# Patient Record
Sex: Male | Born: 1980 | Race: Black or African American | Hispanic: No | Marital: Single | Smoking: Never smoker
Health system: Southern US, Community
[De-identification: ages and names within clinical notes are randomized; demographics above are authoritative.]

## PROBLEM LIST (undated history)

## (undated) ENCOUNTER — Ambulatory Visit (HOSPITAL_COMMUNITY): Disposition: A | Discharge status: Other Acute Inpatient Hospital | Payer: Self-pay

## (undated) DIAGNOSIS — R51 Headache: Secondary | ICD-10-CM

## (undated) DIAGNOSIS — R519 Headache, unspecified: Secondary | ICD-10-CM

## (undated) HISTORY — DX: Headache, unspecified: R51.9

## (undated) HISTORY — DX: Headache: R51

## (undated) HISTORY — PX: JOINT REPLACEMENT: SHX530

---
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
## 2003-05-20 ENCOUNTER — Encounter: Payer: Self-pay | Admitting: Orthopaedic Surgery

## 2003-05-21 ENCOUNTER — Encounter: Payer: Self-pay | Admitting: Orthopaedic Surgery

## 2003-05-21 ENCOUNTER — Inpatient Hospital Stay (HOSPITAL_COMMUNITY): Admission: AC | Admit: 2003-05-21 | Discharge: 2003-05-27 | Payer: Self-pay

## 2003-05-24 ENCOUNTER — Encounter: Payer: Self-pay | Admitting: Orthopedic Surgery

## 2017-05-26 ENCOUNTER — Emergency Department (HOSPITAL_COMMUNITY): Payer: Self-pay

## 2017-05-26 ENCOUNTER — Encounter (HOSPITAL_COMMUNITY): Payer: Self-pay | Admitting: Emergency Medicine

## 2017-05-26 ENCOUNTER — Emergency Department (HOSPITAL_COMMUNITY)
Admission: EM | Admit: 2017-05-26 | Discharge: 2017-05-26 | Disposition: A | Discharge status: Home / Self Care | Payer: Self-pay | Attending: Emergency Medicine | Admitting: Emergency Medicine

## 2017-05-26 DIAGNOSIS — F17200 Nicotine dependence, unspecified, uncomplicated: Secondary | ICD-10-CM | POA: Insufficient documentation

## 2017-05-26 DIAGNOSIS — W19XXXA Unspecified fall, initial encounter: Secondary | ICD-10-CM

## 2017-05-26 DIAGNOSIS — M79642 Pain in left hand: Secondary | ICD-10-CM | POA: Insufficient documentation

## 2017-05-26 DIAGNOSIS — M25532 Pain in left wrist: Secondary | ICD-10-CM | POA: Insufficient documentation

## 2017-05-26 NOTE — Discharge Instructions (Signed)
You can take tylenol or motrin for pain.   I would wear the brace while you're working at least, can wear more often if you need to. Follow-up with your primary care doctor. Return to the ED for new or worsening symptoms.
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
## 2017-05-26 NOTE — Progress Notes (Signed)
Orthopedic Tech Progress Note Patient Details:  Raymond Wheeler May 15, 1981 [id number]  Ortho Devices Type of Ortho Device: Ace wrap, Velcro wrist forearm splint Ortho Device/Splint Interventions: Application   Saul Fordyce 05/26/2017, 7:59 PM

## 2018-09-03 HISTORY — PX: MOUTH SURGERY: SHX715

## 2018-09-26 ENCOUNTER — Observation Stay (HOSPITAL_COMMUNITY)
Admission: EM | Admit: 2018-09-26 | Discharge: 2018-09-27 | Disposition: A | Discharge status: Home / Self Care | Payer: BLUE CROSS/BLUE SHIELD | Attending: Oral Surgery | Admitting: Oral Surgery

## 2018-09-26 ENCOUNTER — Emergency Department (HOSPITAL_COMMUNITY): Payer: BLUE CROSS/BLUE SHIELD

## 2018-09-26 DIAGNOSIS — S02401B Maxillary fracture, unspecified, initial encounter for open fracture: Secondary | ICD-10-CM

## 2018-09-26 DIAGNOSIS — S0292XB Unspecified fracture of facial bones, initial encounter for open fracture: Secondary | ICD-10-CM

## 2018-09-26 DIAGNOSIS — S032XXA Dislocation of tooth, initial encounter: Secondary | ICD-10-CM

## 2018-09-26 DIAGNOSIS — S0993XA Unspecified injury of face, initial encounter: Secondary | ICD-10-CM

## 2018-09-26 DIAGNOSIS — S022XXA Fracture of nasal bones, initial encounter for closed fracture: Secondary | ICD-10-CM | POA: Insufficient documentation

## 2018-09-26 DIAGNOSIS — Y92410 Unspecified street and highway as the place of occurrence of the external cause: Secondary | ICD-10-CM | POA: Insufficient documentation

## 2018-09-26 DIAGNOSIS — S0242XB Fracture of alveolus of maxilla, initial encounter for open fracture: Principal | ICD-10-CM | POA: Insufficient documentation

## 2018-09-26 DIAGNOSIS — N3090 Cystitis, unspecified without hematuria: Secondary | ICD-10-CM | POA: Diagnosis not present

## 2018-09-26 DIAGNOSIS — T148XXA Other injury of unspecified body region, initial encounter: Secondary | ICD-10-CM

## 2018-09-26 DIAGNOSIS — Z419 Encounter for procedure for purposes other than remedying health state, unspecified: Secondary | ICD-10-CM

## 2018-09-26 LAB — URINALYSIS, ROUTINE W REFLEX MICROSCOPIC
Bilirubin Urine: NEGATIVE
Glucose, UA: NEGATIVE mg/dL
Hgb urine dipstick: NEGATIVE
Ketones, ur: 5 mg/dL — AB
Nitrite: POSITIVE — AB
Protein, ur: NEGATIVE mg/dL
Specific Gravity, Urine: >1.046 — ABNORMAL HIGH (ref 1.005–1.030)
pH: 8 (ref 5.0–8.0)

## 2018-09-26 LAB — COMPREHENSIVE METABOLIC PANEL
ALT: 22 U/L (ref 0–44)
AST: 39 U/L (ref 15–41)
Albumin: 5.1 g/dL — ABNORMAL HIGH (ref 3.5–5.0)
Alkaline Phosphatase: 47 U/L (ref 38–126)
Anion gap: 16 — ABNORMAL HIGH (ref 5–15)
BUN: 10 mg/dL (ref 6–20)
CALCIUM: 10.2 mg/dL (ref 8.9–10.3)
CO2: 20 mmol/L — ABNORMAL LOW (ref 22–32)
Chloride: 101 mmol/L (ref 98–111)
Creatinine, Ser: 1.16 mg/dL (ref 0.61–1.24)
GFR calc non Af Amer: >60 mL/min (ref >60)
Glucose, Bld: 123 mg/dL — ABNORMAL HIGH (ref 70–99)
Potassium: 3.5 mmol/L (ref 3.5–5.1)
Sodium: 137 mmol/L (ref 135–145)
Total Bilirubin: 1.3 mg/dL — ABNORMAL HIGH (ref 0.3–1.2)
Total Protein: 8.3 g/dL — ABNORMAL HIGH (ref 6.5–8.1)

## 2018-09-26 LAB — SAMPLE TO BLOOD BANK

## 2018-09-26 LAB — CBC
HCT: 44.1 % (ref 39.0–52.0)
Hemoglobin: 15 g/dL (ref 13.0–17.0)
MCH: 32.5 pg (ref 26.0–34.0)
MCHC: 34 g/dL (ref 30.0–36.0)
MCV: 95.5 fL (ref 80.0–100.0)
NRBC: 0 % (ref 0.0–0.2)
Platelets: 183 10*3/uL (ref 150–400)
RBC: 4.62 MIL/uL (ref 4.22–5.81)
RDW: 13.2 % (ref 11.5–15.5)
WBC: 4.6 10*3/uL (ref 4.0–10.5)

## 2018-09-26 LAB — I-STAT CREATININE, ED: Creatinine, Ser: 1 mg/dL (ref 0.61–1.24)

## 2018-09-26 LAB — PROTIME-INR
INR: 1.06
Prothrombin Time: 13.7 seconds (ref 11.4–15.2)

## 2018-09-26 LAB — ETHANOL: Alcohol, Ethyl (B): <10 mg/dL (ref <10)

## 2018-09-26 LAB — CDS SEROLOGY

## 2018-09-26 LAB — LACTIC ACID, PLASMA: Lactic Acid, Venous: 6.9 mmol/L (ref 0.5–1.9)

## 2018-09-26 MED ORDER — TETANUS-DIPHTH-ACELL PERTUSSIS 5-2.5-18.5 LF-MCG/0.5 IM SUSP
0.5000 mL | Freq: Once | INTRAMUSCULAR | Status: AC
  Administered 2018-09-26: 0.5 mL via INTRAMUSCULAR
  Filled 2018-09-26: qty 0.5

## 2018-09-26 MED ORDER — LACTATED RINGERS IV BOLUS
1000.0000 mL | Freq: Once | INTRAVENOUS | Status: AC
  Administered 2018-09-26: 1000 mL via INTRAVENOUS

## 2018-09-26 MED ORDER — MORPHINE SULFATE (PF) 4 MG/ML IV SOLN
4.0000 mg | Freq: Once | INTRAVENOUS | Status: AC
  Administered 2018-09-26: 4 mg via INTRAVENOUS
  Filled 2018-09-26: qty 1

## 2018-09-26 MED ORDER — IOPAMIDOL (ISOVUE-370) INJECTION 76%
100.0000 mL | Freq: Once | INTRAVENOUS | Status: AC | PRN
  Administered 2018-09-26: 100 mL via INTRAVENOUS
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
## 2018-09-26 NOTE — ED Notes (Signed)
Patient transported to CT with RN 

## 2018-09-26 NOTE — Procedures (Signed)
Preop diagnosis: Lower lip laceration Postop diagnosis: same Procedure: Moderate complexity closure lower lip laceration, 3 cm Surgeon: Jenne Pane Anesth: Local with 2% lidocaine with 1:200,000 epinephrine Compl: None Findings: Through-and-through lower lip wound Description:  After discussing risks, benefits, and alternatives, the patient was placed in a reclined position.  The lower face was prepped with Betadyne and draped.  The lower lip wound was injected with local anesthetic.  The wound was then copiously irrigated with saline.  The deep muscle was closed with 4-0 Vicryl in a simple, interrupted fashion.  The skin was closed with 5-0 Nylon in a simple, running fashion.  The inner surface of the wound was left open.  He tolerate the procedure well and was returned to nursing care in stable condition.
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
## 2018-09-27 ENCOUNTER — Observation Stay (HOSPITAL_COMMUNITY): Payer: BLUE CROSS/BLUE SHIELD

## 2018-09-27 ENCOUNTER — Observation Stay (HOSPITAL_COMMUNITY): Payer: BLUE CROSS/BLUE SHIELD | Admitting: Certified Registered"

## 2018-09-27 ENCOUNTER — Encounter (HOSPITAL_COMMUNITY)
Admission: EM | Disposition: A | Discharge status: Home / Self Care | Payer: Self-pay | Source: Home / Self Care | Attending: Emergency Medicine

## 2018-09-27 ENCOUNTER — Other Ambulatory Visit: Payer: Self-pay

## 2018-09-27 ENCOUNTER — Encounter (HOSPITAL_COMMUNITY): Payer: Self-pay | Admitting: Certified Registered"

## 2018-09-27 DIAGNOSIS — S0242XB Fracture of alveolus of maxilla, initial encounter for open fracture: Secondary | ICD-10-CM | POA: Diagnosis present

## 2018-09-27 HISTORY — PX: ORIF FACIAL FRACTURE: SHX2118

## 2018-09-27 LAB — SURGICAL PCR SCREEN
MRSA, PCR: NEGATIVE
Staphylococcus aureus: NEGATIVE

## 2018-09-27 SURGERY — OPEN REDUCTION INTERNAL FIXATION (ORIF) MULTIPLE FACIAL FRACTURES
Anesthesia: General | Site: Face

## 2018-09-27 MED ORDER — METOPROLOL TARTRATE 5 MG/5ML IV SOLN: 5.0000 mg | Freq: Four times a day (QID) | INTRAVENOUS | Status: DC | PRN

## 2018-09-27 MED ORDER — ONDANSETRON HCL 4 MG/2ML IJ SOLN
INTRAMUSCULAR | Status: AC
  Filled 2018-09-27: qty 4

## 2018-09-27 MED ORDER — LIDOCAINE-EPINEPHRINE 2 %-1:100000 IJ SOLN
INTRAMUSCULAR | Status: AC
  Filled 2018-09-27: qty 1

## 2018-09-27 MED ORDER — MIDAZOLAM HCL 2 MG/2ML IJ SOLN
INTRAMUSCULAR | Status: AC
  Filled 2018-09-27: qty 2

## 2018-09-27 MED ORDER — HYDROMORPHONE HCL 1 MG/ML IJ SOLN
1.0000 mg | INTRAMUSCULAR | Status: DC | PRN
  Administered 2018-09-27: 1 mg via INTRAVENOUS
  Filled 2018-09-27: qty 1

## 2018-09-27 MED ORDER — CHLORHEXIDINE GLUCONATE 0.12 % MT SOLN: 15.0000 mL | Freq: Two times a day (BID) | OROMUCOSAL | 0 refills | Status: AC

## 2018-09-27 MED ORDER — PROMETHAZINE HCL 25 MG/ML IJ SOLN: 6.2500 mg | INTRAMUSCULAR | Status: DC | PRN

## 2018-09-27 MED ORDER — DEXAMETHASONE SODIUM PHOSPHATE 10 MG/ML IJ SOLN
INTRAMUSCULAR | Status: AC
  Filled 2018-09-27: qty 1

## 2018-09-27 MED ORDER — OXYCODONE HCL 5 MG PO TABS
5.0000 mg | ORAL_TABLET | ORAL | Status: DC | PRN
  Administered 2018-09-27: 10 mg via ORAL
  Filled 2018-09-27: qty 2

## 2018-09-27 MED ORDER — OXYMETAZOLINE HCL 0.05 % NA SOLN
NASAL | Status: AC
  Filled 2018-09-27: qty 15

## 2018-09-27 MED ORDER — ROCURONIUM BROMIDE 50 MG/5ML IV SOSY
PREFILLED_SYRINGE | INTRAVENOUS | Status: AC
  Filled 2018-09-27: qty 5

## 2018-09-27 MED ORDER — FENTANYL CITRATE (PF) 100 MCG/2ML IJ SOLN: 25.0000 ug | INTRAMUSCULAR | Status: DC | PRN

## 2018-09-27 MED ORDER — CLINDAMYCIN HCL 300 MG PO CAPS: 300.0000 mg | ORAL_CAPSULE | Freq: Three times a day (TID) | ORAL | 0 refills | Status: DC

## 2018-09-27 MED ORDER — FENTANYL CITRATE (PF) 100 MCG/2ML IJ SOLN
INTRAMUSCULAR | Status: DC | PRN
  Administered 2018-09-27: 100 ug via INTRAVENOUS

## 2018-09-27 MED ORDER — CLINDAMYCIN PHOSPHATE 600 MG/50ML IV SOLN
600.0000 mg | Freq: Four times a day (QID) | INTRAVENOUS | Status: DC
  Administered 2018-09-27 (×3): 600 mg via INTRAVENOUS
  Filled 2018-09-27 (×4): qty 50

## 2018-09-27 MED ORDER — DEXTROSE-NACL 5-0.45 % IV SOLN
INTRAVENOUS | Status: DC
  Administered 2018-09-27: 02:00:00 via INTRAVENOUS

## 2018-09-27 MED ORDER — SODIUM CHLORIDE 0.9 % IV SOLN: 1.0000 g | Freq: Once | INTRAVENOUS | Status: DC

## 2018-09-27 MED ORDER — CIPROFLOXACIN HCL 500 MG PO TABS: 500.0000 mg | ORAL_TABLET | Freq: Two times a day (BID) | ORAL | 0 refills | Status: AC

## 2018-09-27 MED ORDER — MORPHINE SULFATE (PF) 4 MG/ML IV SOLN: 4.0000 mg | Freq: Once | INTRAVENOUS | Status: DC

## 2018-09-27 MED ORDER — ONDANSETRON HCL 4 MG/2ML IJ SOLN
INTRAMUSCULAR | Status: DC | PRN
  Administered 2018-09-27: 4 mg via INTRAVENOUS

## 2018-09-27 MED ORDER — LIDOCAINE-EPINEPHRINE 2 %-1:100000 IJ SOLN
INTRAMUSCULAR | Status: DC | PRN
  Administered 2018-09-27: 14 mL via INTRADERMAL

## 2018-09-27 MED ORDER — LIDOCAINE-EPINEPHRINE 2 %-1:100000 IJ SOLN
INTRAMUSCULAR | Status: AC
  Filled 2018-09-27: qty 1.7

## 2018-09-27 MED ORDER — PROPOFOL 10 MG/ML IV BOLUS
INTRAVENOUS | Status: DC | PRN
  Administered 2018-09-27: 150 mg via INTRAVENOUS

## 2018-09-27 MED ORDER — LIDOCAINE 2% (20 MG/ML) 5 ML SYRINGE
INTRAMUSCULAR | Status: AC
  Filled 2018-09-27: qty 5

## 2018-09-27 MED ORDER — MIDAZOLAM HCL 5 MG/5ML IJ SOLN
INTRAMUSCULAR | Status: DC | PRN
  Administered 2018-09-27: 2 mg via INTRAVENOUS

## 2018-09-27 MED ORDER — LACTATED RINGERS IV BOLUS: 1000.0000 mL | Freq: Once | INTRAVENOUS | Status: DC

## 2018-09-27 MED ORDER — ROCURONIUM BROMIDE 10 MG/ML (PF) SYRINGE
PREFILLED_SYRINGE | INTRAVENOUS | Status: DC | PRN
  Administered 2018-09-27: 50 mg via INTRAVENOUS

## 2018-09-27 MED ORDER — ONDANSETRON 4 MG PO TBDP: 4.0000 mg | ORAL_TABLET | Freq: Four times a day (QID) | ORAL | Status: DC | PRN

## 2018-09-27 MED ORDER — LACTATED RINGERS IV SOLN
INTRAVENOUS | Status: DC | PRN
  Administered 2018-09-27: 07:00:00 via INTRAVENOUS

## 2018-09-27 MED ORDER — FENTANYL CITRATE (PF) 250 MCG/5ML IJ SOLN
INTRAMUSCULAR | Status: AC
  Filled 2018-09-27: qty 5

## 2018-09-27 MED ORDER — OXYCODONE-ACETAMINOPHEN 5-325 MG PO TABS: 1.0000 | ORAL_TABLET | ORAL | 0 refills | Status: AC | PRN

## 2018-09-27 MED ORDER — OXYMETAZOLINE HCL 0.05 % NA SOLN
NASAL | Status: DC | PRN
  Administered 2018-09-27: 1 via NASAL

## 2018-09-27 MED ORDER — LIDOCAINE 2% (20 MG/ML) 5 ML SYRINGE
INTRAMUSCULAR | Status: DC | PRN
  Administered 2018-09-27: 60 mg via INTRAVENOUS

## 2018-09-27 MED ORDER — OXYCODONE HCL 5 MG PO TABS
10.0000 mg | ORAL_TABLET | Freq: Once | ORAL | Status: AC
  Administered 2018-09-27: 10 mg via ORAL
  Filled 2018-09-27: qty 2

## 2018-09-27 MED ORDER — SUGAMMADEX SODIUM 200 MG/2ML IV SOLN
INTRAVENOUS | Status: DC | PRN
  Administered 2018-09-27: 200 mg via INTRAVENOUS

## 2018-09-27 MED ORDER — BACITRACIN ZINC 500 UNIT/GM EX OINT
TOPICAL_OINTMENT | CUTANEOUS | Status: AC
  Filled 2018-09-27: qty 28.35

## 2018-09-27 MED ORDER — PROPOFOL 10 MG/ML IV BOLUS
INTRAVENOUS | Status: AC
  Filled 2018-09-27: qty 20

## 2018-09-27 MED ORDER — DEXAMETHASONE SODIUM PHOSPHATE 10 MG/ML IJ SOLN
INTRAMUSCULAR | Status: DC | PRN
  Administered 2018-09-27: 10 mg via INTRAVENOUS

## 2018-09-27 MED ORDER — ONDANSETRON HCL 4 MG/2ML IJ SOLN
4.0000 mg | Freq: Four times a day (QID) | INTRAMUSCULAR | Status: DC | PRN
  Administered 2018-09-27: 4 mg via INTRAVENOUS
  Filled 2018-09-27: qty 2

## 2018-09-27 MED ORDER — ONDANSETRON HCL 4 MG/2ML IJ SOLN: 4.0000 mg | Freq: Once | INTRAMUSCULAR | Status: DC

## 2018-09-27 MED ORDER — 0.9 % SODIUM CHLORIDE (POUR BTL) OPTIME
TOPICAL | Status: DC | PRN
  Administered 2018-09-27: 1000 mL

## 2018-09-27 SURGICAL SUPPLY — 46 items
APPLICATOR COTTON TIP 6IN STRL (MISCELLANEOUS) ×3 IMPLANT
BENZOIN TINCTURE PRP APPL 2/3 (GAUZE/BANDAGES/DRESSINGS) ×3 IMPLANT
BLADE 10 SAFETY STRL DISP (BLADE) ×3 IMPLANT
BLADE SURG 15 STRL LF DISP TIS (BLADE) ×1 IMPLANT
BLADE SURG 15 STRL SS (BLADE) ×2
CANISTER SUCT 3000ML PPV (MISCELLANEOUS) ×6 IMPLANT
CLOSURE WOUND 1/2 X4 (GAUZE/BANDAGES/DRESSINGS)
CONFORMERS SILICONE 5649 (OPHTHALMIC RELATED) IMPLANT
COVER SURGICAL LIGHT HANDLE (MISCELLANEOUS) ×3 IMPLANT
COVER WAND RF STERILE (DRAPES) ×3 IMPLANT
DECANTER SPIKE VIAL GLASS SM (MISCELLANEOUS) ×3 IMPLANT
ELECT COATED BLADE 2.86 ST (ELECTRODE) IMPLANT
ELECT NEEDLE BLADE 2-5/6 (NEEDLE) IMPLANT
ELECT REM PT RETURN 9FT ADLT (ELECTROSURGICAL) ×3
ELECTRODE REM PT RTRN 9FT ADLT (ELECTROSURGICAL) ×1 IMPLANT
GAUZE 4X4 16PLY RFD (DISPOSABLE) ×3 IMPLANT
GAUZE PACKING FOLDED 2  STR (GAUZE/BANDAGES/DRESSINGS)
GAUZE PACKING FOLDED 2 STR (GAUZE/BANDAGES/DRESSINGS) IMPLANT
GLOVE BIO SURGEON STRL SZ 6.5 (GLOVE) ×6 IMPLANT
GLOVE BIO SURGEON STRL SZ7.5 (GLOVE) ×9 IMPLANT
GLOVE BIO SURGEONS STRL SZ 6.5 (GLOVE) ×3
GOWN STRL REUS W/ TWL LRG LVL3 (GOWN DISPOSABLE) ×2 IMPLANT
GOWN STRL REUS W/ TWL XL LVL3 (GOWN DISPOSABLE) ×1 IMPLANT
GOWN STRL REUS W/TWL LRG LVL3 (GOWN DISPOSABLE) ×4
GOWN STRL REUS W/TWL XL LVL3 (GOWN DISPOSABLE) ×2
KIT TURNOVER KIT B (KITS) ×3 IMPLANT
NEEDLE BLUNT 16X1.5 OR ONLY (NEEDLE) IMPLANT
NS IRRIG 1000ML POUR BTL (IV SOLUTION) ×3 IMPLANT
PAD ARMBOARD 7.5X6 YLW CONV (MISCELLANEOUS) ×6 IMPLANT
PENCIL BUTTON HOLSTER BLD 10FT (ELECTRODE) ×3 IMPLANT
SCISSORS WIRE ANG 4 3/4 DISP (INSTRUMENTS) IMPLANT
SLEEVE IRRIGATION ELITE 7 (MISCELLANEOUS) ×3 IMPLANT
STRIP CLOSURE SKIN 1/2X4 (GAUZE/BANDAGES/DRESSINGS) IMPLANT
SUT CHROMIC 3 0 PS 2 (SUTURE) ×6 IMPLANT
SUT ETHILON 5 0 P 3 18 (SUTURE) ×2
SUT NYLON ETHILON 5-0 P-3 1X18 (SUTURE) ×1 IMPLANT
SUT STEEL 0 (SUTURE)
SUT STEEL 0 18XMFL TIE 17 (SUTURE) IMPLANT
SUT STEEL 2 (SUTURE) IMPLANT
SYR 50ML SLIP (SYRINGE) ×3 IMPLANT
TRAY ENT MC OR (CUSTOM PROCEDURE TRAY) ×3 IMPLANT
TUBE CONNECTING 12'X1/4 (SUCTIONS) ×1
TUBE CONNECTING 12X1/4 (SUCTIONS) ×2 IMPLANT
TUBING IRRIGATION (MISCELLANEOUS) IMPLANT
WATER STERILE IRR 1000ML POUR (IV SOLUTION) ×3 IMPLANT
YANKAUER SUCT BULB TIP NO VENT (SUCTIONS) ×3 IMPLANT

## 2018-09-27 NOTE — Anesthesia Preprocedure Evaluation (Addendum)
Anesthesia Evaluation  Patient identified by MRN, date of birth, ID band Patient awake    Reviewed: Allergy & Precautions, NPO status , Patient's Chart, lab work & pertinent test results  Airway Mallampati: II  TM Distance: >3 FB Neck ROM: Full    Dental  (+) Dental Advisory Given, Poor Dentition, Loose, Missing,    Pulmonary neg pulmonary ROS,    Pulmonary exam normal breath sounds clear to auscultation       Cardiovascular negative cardio ROS Normal cardiovascular exam Rhythm:Regular Rate:Normal     Neuro/Psych negative neurological ROS     GI/Hepatic negative GI ROS, Neg liver ROS,   Endo/Other  negative endocrine ROS  Renal/GU negative Renal ROS     Musculoskeletal MAXILLARY FRACTURE   Abdominal   Peds  Hematology negative hematology ROS (+)   Anesthesia Other Findings Day of surgery medications reviewed with the patient.  Reproductive/Obstetrics                            Anesthesia Physical Anesthesia Plan  ASA: I  Anesthesia Plan: General   Post-op Pain Management:    Induction: Intravenous  PONV Risk Score and Plan: 3 and Midazolam, Ondansetron and Dexamethasone  Airway Management Planned: Nasal ETT  Additional Equipment: None  Intra-op Plan:   Post-operative Plan: Extubation in OR  Informed Consent: I have reviewed the patients History and Physical, chart, labs and discussed the procedure including the risks, benefits and alternatives for the proposed anesthesia with the patient or authorized representative who has indicated his/her understanding and acceptance.     Dental advisory given  Plan Discussed with: CRNA, Anesthesiologist and Surgeon  Anesthesia Plan Comments:       Anesthesia Quick Evaluation

## 2018-09-27 NOTE — Progress Notes (Signed)
RN verified the presence of a signed informed consent that matches stated procedure by patient. Consent signed in Short Stay. Verified armband matches patient's stated name and birth date. Verified NPO status and that all jewelry, contact, glasses, dentures, and partials had been removed (if applicable).  

## 2018-09-27 NOTE — H&P (Signed)
H&P documentation  -History and Physical Reviewed  -Patient has been re-examined  -No change in the plan of care  Raymond Wheeler  

## 2018-09-27 NOTE — Plan of Care (Signed)
  Problem: Education: Goal: Knowledge of General Education information will improve Description Including pain rating scale, medication(s)/side effects and non-pharmacologic comfort measures Outcome: Progressing   Problem: Clinical Measurements: Goal: Ability to maintain clinical measurements within normal limits will improve Outcome: Progressing Goal: Diagnostic test results will improve Outcome: Progressing Goal: Respiratory complications will improve Outcome: Progressing Goal: Cardiovascular complication will be avoided Outcome: Progressing   Problem: Elimination: Goal: Will not experience complications related to bowel motility Outcome: Progressing   Problem: Pain Managment: Goal: General experience of comfort will improve Outcome: Progressing

## 2018-09-27 NOTE — Op Note (Signed)
NAME: Raymond Wheeler, Raymond Wheeler MEDICAL RECORD HL:[id number] ACCOUNT [id number] DATE OF BIRTH:1981/06/13 FACILITY: [facility] LOCATION: [facility] PHYSICIAN:Jakhiya Brower M. Raj Landress, DDS  OPERATIVE REPORT  DATE OF PROCEDURE:  09/26/2018  PREOPERATIVE DIAGNOSIS:  Maxillary alveolar fracture avulsed teeth 7 and 9, severely displaced teeth 8 and 10.  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  PROCEDURE:  Open reduction maxillary fractures, removal tooth 11, suture laceration of lower lip mucosa.  SURGEON:  Ocie Doyne, DDS  ANESTHESIA:  General nasal intubation, Dr. Desmond Lope attending.  DESCRIPTION OF PROCEDURE:  The patient was taken to the operating room and placed on the table in supine position.  General anesthesia was administered intravenously and a nasal endotracheal tube was placed and secured atraumatically.  The patient was  draped for surgery.  A timeout was performed.  The posterior pharynx was suctioned and a throat pack was placed, 2% lidocaine 1:100,000 epinephrine was infiltrated buccally and palatally in the maxillary anterior.  The oral cavity was then inspected.  It  was found that the three incisors 7, 8 and 9 were not obviously apparent in the mouth.  Tooth 10 was not present, but tooth 11 was in fact the tooth that had been called 10 on the CAT scan.  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 Tooth H was the canine and was also present.  Then, the mucosa was reflected with attached bony segments and the internal aspect of the maxillary alveolar bone was intact.  This area was irrigated.  It was inspected superiorly up into the floor of the  nose mucosa and laterally to the extent of the lacerations.  No tooth fragments were found.  There was some concern because on the CAT scan the previous evening tooth 8 appeared to be present and was  expected to be up underneath the mucosa, but  apparently the tooth was so loose the patient removed it himself.  At any rate, there was no additional tooth fragment noted.  Radiology was called in and AP of the maxilla was taken intraoperatively and confirmed no teeth in the anterior maxilla  remained embedded.  Then, the upper maxilla was irrigated.  Sutures multiple interrupted and horizontal mattress sutures were placed to retract reapproximating the bone manually.  The area was closed primarily with 3-0 chromic gut from tooth 6 to primary  tooth H.  Then, the oral cavity was inspected.  There was a through and through lip laceration that had been closed the prior evening in the ER but the oral aspect of the laceration was opened.  Then, local anesthesia 2% lidocaine 1:100,000 epinephrine  was infiltrated in the margins of the laceration which was approximately 4 cm long, 3-0 chromic continuous interlocking gut was used to close this laceration after trimming the tissues with Dean scissors.  Then, the oral cavity was inspected.  The bite  block was removed.  The bite was confirmed to be posterior bilateral and equal.  Then, the oral cavity was irrigated and suctioned and a throat pack was removed.  The patient was left in care of anesthesia for extubation and transport to recovery room  with plans for discharge home from the floor.  ESTIMATED BLOOD LOSS:  Minimal.  COMPLICATIONS:  None.  SPECIMENS:  None.  TN/NUANCE  D:09/27/2018 T:09/27/2018 JOB:[id number]

## 2018-09-27 NOTE — Op Note (Signed)
09/27/2018  8:25 AM  PATIENT:  Otila Back  38 y.o. male  PRE-OPERATIVE DIAGNOSIS:  MAXILLARY FRACTURE, Displaced teeth # 8, 10  POST-OPERATIVE DIAGNOSIS:  MAXILLARY ALVEOLAR FRACTURE, AVULSED/MISSING TEETH # 7, 8, 9, DISPLACED TOOTH # 11, CONGENITALLY MISSING TOOTH # 10, RETAINED PRIMARY TOOTH #H, LACERATION LEFT LOWER LIP MUCOSA  PROCEDURE:  Procedure(s): OPEN REDUCTION  MAXILLARY FRACTURES, REMOVAL TOOTH #11; SUTURE LACERATION LOWER LIP MUCOSA  SURGEON:  Surgeon(s): Ocie Doyne, DDS  ANESTHESIA:   local and general  EBL:  minimal  DRAINS: none   SPECIMEN:  No Specimen  COUNTS:  YES  PLAN OF CARE: Discharge to home after PACU  PATIENT DISPOSITION:  PACU - hemodynamically stable.   PROCEDURE DETAILS: Dictation # [id number]  Georgia Lopes, DMD 09/27/2018 8:25 AM

## 2018-09-27 NOTE — Anesthesia Postprocedure Evaluation (Signed)
Anesthesia Post Note  Patient: CHALMER MACNAUGHTON  Procedure(s) Performed: OPEN REDUCTION INTERNAL FIXATION (ORIF) MAXILLARY FRACTURES  CLOSED REDUCTION MAXILLARYAVEOLAR FRACTURE REPOSITION OR REMOVE 8 AND OR 10 TOOTH (N/A Face)     Patient location during evaluation: PACU Anesthesia Type: General Level of consciousness: awake and alert Pain management: pain level controlled Vital Signs Assessment: post-procedure vital signs reviewed and stable Respiratory status: spontaneous breathing, nonlabored ventilation and respiratory function stable Cardiovascular status: blood pressure returned to baseline and stable Postop Assessment: no apparent nausea or vomiting Anesthetic complications: no    Last Vitals:  Vitals:   09/27/18 0858 09/27/18 0924  BP: 113/83 108/72  Pulse: 67 60  Resp:  16  Temp:  36.4 C  SpO2: 98% 100%    Last Pain:  Vitals:   09/27/18 0924  TempSrc: Oral  PainSc:                  Cecile Hearing

## 2018-09-27 NOTE — Transfer of Care (Signed)
Immediate Anesthesia Transfer of Care Note  Patient: Raymond Wheeler  Procedure(s) Performed: OPEN REDUCTION INTERNAL FIXATION (ORIF) MAXILLARY FRACTURES  CLOSED REDUCTION MAXILLARYAVEOLAR FRACTURE REPOSITION OR REMOVE 8 AND OR 10 TOOTH (N/A Face)  Patient Location: PACU  Anesthesia Type:General  Level of Consciousness: awake and patient cooperative  Airway & Oxygen Therapy: Patient Spontanous Breathing and Patient connected to nasal cannula oxygen  Post-op Assessment: Report given to RN, Post -op Vital signs reviewed and stable and Patient moving all extremities  Post vital signs: Reviewed and stable  Last Vitals:  Vitals Value Taken Time  BP    Temp    Pulse 80 09/27/2018  8:42 AM  Resp 17 09/27/2018  8:42 AM  SpO2 100 % 09/27/2018  8:42 AM  Vitals shown include unvalidated device data.  Last Pain:  Vitals:   09/27/18 0354  TempSrc:   PainSc: 2       Patients Stated Pain Goal: 2 (09/27/18 0254)  Complications: No apparent anesthesia complications

## 2018-09-27 NOTE — Progress Notes (Signed)
Patient discharging home. Discharge instructions explained to patient and patient's mother and they both verbalized understanding. Took all personal belongings. No further questions or concerns voiced.

## 2018-09-27 NOTE — Anesthesia Procedure Notes (Signed)
Procedure Name: Intubation Date/Time: 09/27/2018 7:39 AM Performed by: Moshe Salisbury, CRNA Pre-anesthesia Checklist: Patient identified, Emergency Drugs available, Suction available and Patient being monitored Patient Re-evaluated:Patient Re-evaluated prior to induction Oxygen Delivery Method: Circle System Utilized Preoxygenation: Pre-oxygenation with 100% oxygen Induction Type: IV induction Ventilation: Mask ventilation without difficulty Laryngoscope Size: Mac and 4 Grade View: Grade II Nasal Tubes: Nasal Rae, Nasal prep performed, Right and Magill forceps - small, utilized Tube size: 7.5 mm Number of attempts: 1 Intubation method: Red rubber catheter through right nare and into oropharynx and retrieved through mouth with magill forceps. NETT guided through right nasal passage with catheter into oropharynx where catheter removed. NETT though vc under direct vision. Placement Confirmation: ETT inserted through vocal cords under direct vision,  positive ETCO2 and breath sounds checked- equal and bilateral Tube secured with: Tape Dental Injury: Teeth and Oropharynx as per pre-operative assessment

## 2018-09-29 ENCOUNTER — Encounter (HOSPITAL_COMMUNITY): Payer: Self-pay | Admitting: Oral Surgery

## 2018-09-29 ENCOUNTER — Encounter (HOSPITAL_COMMUNITY): Payer: Self-pay | Admitting: Emergency Medicine

## 2018-09-29 LAB — URINE CULTURE: Culture: >=100000 — AB

## 2018-09-29 NOTE — Discharge Summary (Signed)
Patient ID: Raymond Wheeler MRN: [id number] DOB/AGE: 1980/11/28 37 y.o.  Admit date: 09/26/2018 Discharge date: 09/29/2018  Admission Diagnoses:   Open fracture of alveolar ridge of maxilla, Avulsed and displaced maxillary teeth, laceration lower lip  Discharge Diagnoses: Avulsed and displaced maxillary teeth, laceration lower lip  Active Problems:   Open fracture of alveolar ridge of maxilla [facility])   Discharged Condition: good  Hospital Course: Patient underwent suture of lower lip laceration by ENT Dr. Jenne Pane. Was admitted for Maxillary alveolar fracture and scheduled for Open reduction of maxillary fracture the following morning. Patient was discharged the same day as surgery.  Consults: None  Significant Diagnostic Studies: none  Treatments: IV hydration, Surgery, Antibiotics, IV pain management  Discharge Exam: Blood pressure 108/72, pulse 60, temperature 97.6 F (36.4 C), temperature source Oral, resp. rate 16, height 5\' 9"  (1.753 m), weight 65.8 kg, SpO2 100 %. General appearance: alert, cooperative and no distress Head: Normocephalic, without obvious abnormality, atraumatic Eyes: negative Nose: Nares normal. Septum midline. Mucosa normal. No drainage or sinus tenderness. Throat: sutured alveolar fracture/extractions site. Hemostatic.Good occlusion Neck: no adenopathy, supple, symmetrical, trachea midline and thyroid not enlarged, symmetric, no tenderness/mass/nodules Resp: clear to auscultation bilaterally Cardio: regular rate and rhythm, S1, S2 normal, no murmur, click, rub or gallop  Disposition:   Discharge Instructions    Call MD for:  difficulty breathing, headache or visual disturbances   Complete by:  As directed    Call MD for:  persistant nausea and vomiting   Complete by:  As directed    Call MD for:  severe uncontrolled pain   Complete by:  As directed    Call MD for:  temperature >100.4   Complete by:  As directed    Diet - low sodium heart healthy    Complete by:  As directed    Soft Diet. Advance as tolerated.   Discharge instructions   Complete by:  As directed    Ice to affected area for 2-3 days. Soft diet, advance as tolerated. Warm salt water mouth rinses 4-5 times per day starting the day after surgery. No smoking for 2 weeks. Follow-up with Dr. Barbette Merino in 1 week. Call [phone] for appointment   Increase activity slowly   Complete by:  As directed      Allergies as of 09/27/2018   Not on File     Medication List    TAKE these medications   chlorhexidine 0.12 % solution Commonly known as:  PERIDEX Use as directed 15 mLs in the mouth or throat 2 (two) times daily.   ciprofloxacin 500 MG tablet Commonly known as:  CIPRO Take 1 tablet (500 mg total) by mouth 2 (two) times daily for 10 days.   clindamycin 300 MG capsule Commonly known as:  CLEOCIN Take 1 capsule (300 mg total) by mouth 3 (three) times daily.   oxyCODONE-acetaminophen 5-325 MG tablet Commonly known as:  PERCOCET Take 1 tablet by mouth every 4 (four) hours as needed.      Follow-up Information    Christia Reading, MD. Schedule an appointment as soon as possible for a visit in 1 week(s).   Specialty:  Otolaryngology Contact information: [address] [place] [address] [phone]           Signed: Ocie Doyne 09/29/2018, 8:14 AM

## 2018-10-29 ENCOUNTER — Encounter (HOSPITAL_COMMUNITY): Payer: Self-pay

## 2018-10-29 ENCOUNTER — Emergency Department (HOSPITAL_COMMUNITY)
Admission: EM | Admit: 2018-10-29 | Discharge: 2018-10-29 | Disposition: A | Discharge status: Home / Self Care | Payer: BLUE CROSS/BLUE SHIELD | Attending: Emergency Medicine | Admitting: Emergency Medicine

## 2018-10-29 ENCOUNTER — Other Ambulatory Visit: Payer: Self-pay

## 2018-10-29 DIAGNOSIS — R519 Headache, unspecified: Secondary | ICD-10-CM

## 2018-10-29 DIAGNOSIS — K0889 Other specified disorders of teeth and supporting structures: Secondary | ICD-10-CM | POA: Diagnosis not present

## 2018-10-29 DIAGNOSIS — R51 Headache: Secondary | ICD-10-CM | POA: Insufficient documentation

## 2018-10-29 DIAGNOSIS — S022XXD Fracture of nasal bones, subsequent encounter for fracture with routine healing: Secondary | ICD-10-CM | POA: Diagnosis not present

## 2018-10-29 DIAGNOSIS — Z96642 Presence of left artificial hip joint: Secondary | ICD-10-CM | POA: Diagnosis not present

## 2018-10-29 DIAGNOSIS — S022XXG Fracture of nasal bones, subsequent encounter for fracture with delayed healing: Secondary | ICD-10-CM

## 2018-10-29 DIAGNOSIS — X58XXXA Exposure to other specified factors, initial encounter: Secondary | ICD-10-CM | POA: Insufficient documentation

## 2018-10-29 DIAGNOSIS — F17200 Nicotine dependence, unspecified, uncomplicated: Secondary | ICD-10-CM | POA: Diagnosis not present

## 2018-10-29 MED ORDER — ACETAMINOPHEN 500 MG PO TABS
1000.0000 mg | ORAL_TABLET | Freq: Once | ORAL | Status: AC
  Administered 2018-10-29: 1000 mg via ORAL
  Filled 2018-10-29: qty 2

## 2018-10-29 MED ORDER — IBUPROFEN 400 MG PO TABS
400.0000 mg | ORAL_TABLET | Freq: Once | ORAL | Status: AC
  Administered 2018-10-29: 400 mg via ORAL
  Filled 2018-10-29: qty 1

## 2018-10-29 MED ORDER — IBUPROFEN 600 MG PO TABS: 600.0000 mg | ORAL_TABLET | Freq: Three times a day (TID) | ORAL | 0 refills | Status: AC | PRN

## 2018-10-29 NOTE — Discharge Instructions (Signed)
It was our pleasure to provide your ER care today - we hope that you feel better.  Take motrin as need for pain - take with food.   You may also take acetaminophen as need.   Follow up with your oral surgeon, and ENT surgeon in the coming week - call office to arrange appointment.  Return to ER if worse, new symptoms, high fevers, increased swelling/spreading redness, other concern.
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
## 2018-12-01 ENCOUNTER — Telehealth: Payer: Self-pay

## 2018-12-01 NOTE — Telephone Encounter (Signed)
I called pt again, no answer,

## 2018-12-01 NOTE — Telephone Encounter (Signed)
Due to current COVID 19 pandemic, our office is severely reducing in office visits for at least the next 2 weeks, in order to minimize the risk to our patients and healthcare providers.   I called pt to discuss. No answer, left a message asking him to call me back. 

## 2018-12-02 ENCOUNTER — Ambulatory Visit: Payer: BLUE CROSS/BLUE SHIELD | Admitting: Neurology

## 2018-12-09 NOTE — Telephone Encounter (Signed)
Pt returned the call to RN.  Pt was read message from RN on 03-30. Pt was given the option of a VV and told he would need to give verbal consent for insurance to be filed for such a visit.  Pt declined stating he does not have Internet access.  Pt said for now he would prefer to just keep appointment in place for 06-03.

## 2018-12-11 ENCOUNTER — Telehealth: Payer: Self-pay | Admitting: *Deleted

## 2018-12-11 ENCOUNTER — Encounter: Payer: Self-pay | Admitting: *Deleted

## 2018-12-11 NOTE — Telephone Encounter (Signed)
Called patient and updated EMR. He had no questions,  verbalized understanding, appreciation.  

## 2018-12-15 ENCOUNTER — Encounter: Payer: BLUE CROSS/BLUE SHIELD | Admitting: Diagnostic Neuroimaging

## 2018-12-15 ENCOUNTER — Encounter: Payer: Self-pay | Admitting: Diagnostic Neuroimaging

## 2018-12-15 ENCOUNTER — Ambulatory Visit (INDEPENDENT_AMBULATORY_CARE_PROVIDER_SITE_OTHER): Payer: BLUE CROSS/BLUE SHIELD | Admitting: Diagnostic Neuroimaging

## 2018-12-15 ENCOUNTER — Other Ambulatory Visit: Payer: Self-pay

## 2018-12-15 DIAGNOSIS — G44329 Chronic post-traumatic headache, not intractable: Secondary | ICD-10-CM

## 2018-12-15 NOTE — Telephone Encounter (Signed)
Dr Marjory Lies unable to complete video visit this morning due to poor connection. Called patient and rescheduled for 1 pm today. Updated time sent to paitent per webex e mail. Dr Marjory Lies made aware of new time for new pt appt.today at 1 pm.

## 2018-12-15 NOTE — Progress Notes (Signed)
ERROR
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
## 2019-01-15 NOTE — Progress Notes (Signed)
This encounter was created in error - please disregard.

## 2019-02-04 ENCOUNTER — Ambulatory Visit: Payer: BLUE CROSS/BLUE SHIELD | Admitting: Neurology

## 2019-03-10 ENCOUNTER — Encounter: Payer: Self-pay | Admitting: Diagnostic Neuroimaging

## 2019-03-10 ENCOUNTER — Ambulatory Visit (INDEPENDENT_AMBULATORY_CARE_PROVIDER_SITE_OTHER): Payer: BC Managed Care – PPO | Admitting: Diagnostic Neuroimaging

## 2019-03-10 ENCOUNTER — Other Ambulatory Visit: Payer: Self-pay

## 2019-03-10 VITALS — BP 109/75 | HR 57 | Temp 98.0°F | Ht 67.5 in | Wt 129.6 lb

## 2019-03-10 DIAGNOSIS — M545 Low back pain, unspecified: Secondary | ICD-10-CM

## 2019-03-10 DIAGNOSIS — M542 Cervicalgia: Secondary | ICD-10-CM

## 2019-03-10 DIAGNOSIS — R269 Unspecified abnormalities of gait and mobility: Secondary | ICD-10-CM

## 2019-03-10 DIAGNOSIS — G8929 Other chronic pain: Secondary | ICD-10-CM

## 2019-03-10 DIAGNOSIS — M25511 Pain in right shoulder: Secondary | ICD-10-CM
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
## 2020-04-11 ENCOUNTER — Encounter: Payer: Self-pay | Admitting: *Deleted

## 2020-04-11 ENCOUNTER — Encounter: Payer: Self-pay | Admitting: Diagnostic Neuroimaging

## 2020-04-11 ENCOUNTER — Telehealth: Payer: Self-pay | Admitting: Diagnostic Neuroimaging
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
## 2020-04-11 NOTE — Telephone Encounter (Signed)
Spoke with patient and advised him Dr Pearlean Brownie wrote letter and it is ready.  He requested it be e mailed to him. I got e mail and advised medical records can e mail to him tomorrow. Patient verbalized understanding, appreciation.

## 2020-04-11 NOTE — Telephone Encounter (Signed)
Unable to reach patient, voice MB not set up. He needs to contact PCP for this request.
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
## 2020-04-11 NOTE — Telephone Encounter (Signed)
Letter on MD's desk for approval, signature.

## 2020-04-11 NOTE — Telephone Encounter (Signed)
Ok to do a letter
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
## 2021-03-20 ENCOUNTER — Telehealth: Payer: Self-pay | Admitting: *Deleted

## 2021-03-20 NOTE — Telephone Encounter (Signed)
Patient called and stated he needs letter from last Aug e mailed to him again re: mask at work. He stated his condition hasn't improved or changed since he was seen in 03/2019. He does not have a PCP.  I advised will discuss with Dr Marjory Lies and let him know. Patient requested to be called on new mobile [phone]. He verbalized understanding, appreciation.

## 2021-03-20 NOTE — Telephone Encounter (Signed)
Letter on MD desk for review, signature. Letter signed, called him to confirm  e mail on file, no answer, VMB not set up.  Patient called back, requested letter be emailed to [email or web address]. Emailed letter.
